# Patient Record
Sex: Male | Born: 1960 | Race: Black or African American | Hispanic: No | Marital: Married | Smoking: Current some day smoker
Health system: Southern US, Community
[De-identification: ages and names within clinical notes are randomized; demographics above are authoritative.]

## PROBLEM LIST (undated history)

## (undated) DIAGNOSIS — M545 Low back pain, unspecified: Secondary | ICD-10-CM

## (undated) HISTORY — PX: HEMORRHOID SURGERY: SHX153

## (undated) HISTORY — DX: Low back pain, unspecified: M54.50

## (undated) HISTORY — DX: Low back pain: M54.5

---

## 2010-01-21 ENCOUNTER — Encounter: Payer: Self-pay | Admitting: Internal Medicine

## 2010-01-21 ENCOUNTER — Ambulatory Visit: Payer: Self-pay | Admitting: Internal Medicine

## 2010-01-22 ENCOUNTER — Encounter (INDEPENDENT_AMBULATORY_CARE_PROVIDER_SITE_OTHER): Payer: Self-pay | Admitting: *Deleted

## 2010-01-24 LAB — CONVERTED CEMR LAB
ALT: 24 units/L (ref 0–53)
Albumin: 4 g/dL (ref 3.5–5.2)
BUN: 12 mg/dL (ref 6–23)
Basophils Relative: 0.8 % (ref 0.0–3.0)
Chloride: 106 meq/L (ref 96–112)
Cholesterol: 225 mg/dL — ABNORMAL HIGH (ref 0–200)
Eosinophils Relative: 2.4 % (ref 0.0–5.0)
HCT: 39.8 % (ref 39.0–52.0)
Lymphs Abs: 3.1 10*3/uL (ref 0.7–4.0)
MCV: 90.4 fL (ref 78.0–100.0)
Monocytes Absolute: 0.7 10*3/uL (ref 0.1–1.0)
Potassium: 4.3 meq/L (ref 3.5–5.1)
RBC: 4.4 M/uL (ref 4.22–5.81)
TSH: 1.33 microintl units/mL (ref 0.35–5.50)
Total Protein: 7 g/dL (ref 6.0–8.3)
Triglycerides: 122 mg/dL (ref 0.0–149.0)
WBC: 4.5 10*3/uL (ref 4.5–10.5)

## 2010-04-03 ENCOUNTER — Encounter (INDEPENDENT_AMBULATORY_CARE_PROVIDER_SITE_OTHER): Payer: Self-pay

## 2010-04-04 ENCOUNTER — Ambulatory Visit: Payer: Self-pay | Admitting: Gastroenterology

## 2010-04-21 ENCOUNTER — Ambulatory Visit: Admit: 2010-04-21 | Payer: Self-pay | Admitting: Gastroenterology

## 2010-05-06 NOTE — Letter (Signed)
Summary: Pre Visit Letter Revised  [facility]  [address] [place], [place] [address]   Phone: [phone]  Fax: [phone]        01/22/2010 MRN: [id number] Kyle Jensen [address] [place], [place]  [address]             Procedure Date:  03/05/2010   Welcome to the Gastroenterology Division at [facility].    You are scheduled to see a nurse for your pre-procedure visit on 02/18/2010 at 8:30AM on the 3rd floor at [facility], [address]  We ask that you try to arrive at our office 15 minutes prior to your appointment time to allow for check-in.  Please take a minute to review the attached form.  If you answer "Yes" to one or more of the questions on the first page, we ask that you call the person listed at your earliest opportunity.  If you answer "No" to all of the questions, please complete the rest of the form and bring it to your appointment.    Your nurse visit will consist of discussing your medical and surgical history, your immediate family medical history, and your medications.   If you are unable to list all of your medications on the form, please bring the medication bottles to your appointment and we will list them.  We will need to be aware of both prescribed and over the counter drugs.  We will need to know exact dosage information as well.    Please be prepared to read and sign documents such as consent forms, a financial agreement, and acknowledgement forms.  If necessary, and with your consent, a friend or relative is welcome to sit-in on the nurse visit with you.  Please bring your insurance card so that we may make a copy of it.  If your insurance requires a referral to see a specialist, please bring your referral form from your primary care physician.  No co-pay is required for this nurse visit.     If you cannot keep your appointment, please call [phone] to cancel or reschedule prior to your appointment date.  This  allows [place] the opportunity to schedule an appointment for another patient in need of care.    Thank you for choosing [facility] for your medical needs.  We appreciate the opportunity to care for you.  Please visit [place] at our website  to learn more about our practice.  Sincerely, The Gastroenterology Division

## 2010-05-06 NOTE — Assessment & Plan Note (Signed)
Summary: new to est//requesting cpx//will be fasting//lch   Vital Signs:  Patient profile:   50 year old male Height:      69 inches Weight:      183.50 pounds BMI:     27.20 Pulse rate:   75 / minute Pulse rhythm:   regular BP sitting:   130 / 82  (left arm) Cuff size:   regular  Vitals Entered By: Army Fossa CMA (January 21, 2010 2:12 PM) CC: New to establish, CPX, fasting Comments declines flu shot and Td due for colonoscopy [facility]   History of Present Illness: New patient CPX  feels well  no recent check ups  Preventive Screening-Counseling & Management  Alcohol-Tobacco     Smoking Status: current     Packs/Day: 4 a day  Caffeine-Diet-Exercise     Does Patient Exercise: yes      Drug Use:  no.    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   Risk Factors: Exercise: yes (01/21/2010)  Risk Factors: Smoking Status: current (01/21/2010) Packs/Day: 4 a day (01/21/2010)  Past Medical History: no major medical illness  Past Surgical History: hemorrhoidectomy (was in college)  Family History: Lung cancer --mother , smoker  colon ca--no prostate ca-- GF dx at age late 70s Diabetes -- GF MI--no  Social History: married 3 children Current Smoker-- 1/4 ppd  Alcohol use--yes, weekends , social Drug use-no Regular exercise--yes, plays football sundays  Occupation: Clinical biochemist, 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 CV:  Denies palpitations and swelling of feet; 2 months ago have a two-hour episode of chest pain that was steady and worse when he moved  his torso it was located at the left anterior chest without radiation, no associated diaphoresis or shortness of breath. Resp:  Denies cough and shortness of breath. GI:  3 weeks ago had a single episode   red blood per rectum,  he bled  enough that he saw blood in the water and  bowl. At the time had no anal  pain.. GU:  Denies dysuria, hematuria, and urinary frequency; occasionally hesitancy . Psych:  Denies anxiety and depression.  Physical Exam  General:  alert, well-developed, and well-nourished.   Neck:  no masses, no thyromegaly, and normal carotid upstroke.   Lungs:  normal respiratory effort, no intercostal retractions, no accessory muscle use, and normal breath sounds.   Heart:  normal rate, regular rhythm, no murmur, and no gallop.   Abdomen:  soft, non-tender, no distention, no masses, no guarding, and no rigidity.   Rectal:  small external hemorrhoid noted. Normal sphincter tone. No rectal masses or tenderness. Brown stools, Hemoccult negative Prostate:  Prostate gland firm and smooth, no nodularity, tenderness, mass, asymmetry or induration. prostate is slightly enlarged Extremities:  no lower extremity edema Neurologic:  alert & oriented X3, strength normal in all extremities, and gait normal.   Psych:  Cognition and judgment appear intact. Alert and cooperative with normal attention span and concentration, not anxious appearing and not depressed appearing.     Impression & Recommendations:  Problem # 1:  ROUTINE GENERAL MEDICAL EXAM@HEALTH  CARE FACL (ICD-V70.0) Td-- ? 2005 flu shot-- declined , explained benefits   he presents today with a single episode of rectal bleeding 3 weeks ago, digital rectal exam is  negative, Hemoccult negative today. He never had a colonoscopy consequently I will  recommend him to have one  since he is 50 y/o   tobacco-- counseled , information provided EKG, axis deviated, otherwise wnl   Diet exercise discussed  Orders: Venipuncture (54098) TLB-BMP (Basic Metabolic Panel-BMET) (80048-METABOL) TLB-CBC Platelet - w/Differential (85025-CBCD) TLB-Hepatic/Liver Function Pnl (80076-HEPATIC) TLB-Lipid Panel (80061-LIPID) TLB-TSH (Thyroid Stimulating Hormone) (84443-TSH) TLB-PSA (Prostate Specific Antigen) (84153-PSA) Gastroenterology Referral (GI) EKG w/ Interpretation (93000)  Problem # 2:  chest pain single episode of chest pain 2 months ago, somehow atypical. Patient recommended to call me immediately if  pain resurface  Other Orders: Specimen Handling (11914)   Patient Instructions: 1)  Please schedule a follow-up appointment in 1 year.    Orders Added: 1)  Venipuncture [36415] 2)  TLB-BMP (Basic Metabolic Panel-BMET) [80048-METABOL] 3)  TLB-CBC Platelet - w/Differential [85025-CBCD] 4)  TLB-Hepatic/Liver Function Pnl [80076-HEPATIC] 5)  TLB-Lipid Panel [80061-LIPID] 6)  TLB-TSH (Thyroid Stimulating Hormone) [84443-TSH] 7)  TLB-PSA (Prostate Specific Antigen) [84153-PSA] 8)  Specimen Handling [99000] 9)  Gastroenterology Referral [GI] 10)  EKG w/ Interpretation [93000] 11)  New Patient 40-64 years [99386]     Risk Factors:  Tobacco use:  current    Cigarettes:  Yes -- 4 a day pack(s) per day Drug use:  no Alcohol use:  yes Exercise:  yes

## 2010-05-08 NOTE — Letter (Signed)
Summary: Moviprep Instructions  [facility]  [address]   [place], [place] [address]   Phone: [phone]  Fax: [phone]       Kyle Jensen    05-Jan-1961    MRN: [id number]        Procedure Day Dorna Bloom: Monday, 04-21-10     Arrival Time: 2:00 p.m.     Procedure Time: 3:00 p.m.     Location of Procedure:                    x  [facility] (4th Floor)                        PREPARATION FOR COLONOSCOPY WITH MOVIPREP   Starting 5 days prior to your procedure 04-16-10 do not eat nuts, seeds, popcorn, corn, beans, peas,  salads, or any raw vegetables.  Do not take any fiber supplements (e.g. Metamucil, Citrucel, and Benefiber).  THE DAY BEFORE YOUR PROCEDURE         DATE: 04-20-10   DAY: Sunday  1.  Drink clear liquids the entire day-NO SOLID FOOD  2.  Do not drink anything colored red or purple.  Avoid juices with pulp.  No orange juice.  3.  Drink at least 64 oz. (8 glasses) of fluid/clear liquids during the day to prevent dehydration and help the prep work efficiently.  CLEAR LIQUIDS INCLUDE: Water Jello Ice Popsicles Tea (sugar ok, no milk/cream) Powdered fruit flavored drinks Coffee (sugar ok, no milk/cream) Gatorade Juice: apple, white grape, white cranberry  Lemonade Clear bullion, consomm, broth Carbonated beverages (any kind) Strained chicken noodle soup Hard Candy                             4.  In the morning, mix first dose of MoviPrep solution:    Empty 1 Pouch A and 1 Pouch B into the disposable container    Add lukewarm drinking water to the top line of the container. Mix to dissolve    Refrigerate (mixed solution should be used within 24 hrs)  5.  Begin drinking the prep at 5:00 p.m. The MoviPrep container is divided by 4 marks.   Every 15 minutes drink the solution down to the next mark (approximately 8 oz) until the full liter is complete.   6.  Follow completed prep with 16 oz of clear liquid of your choice (Nothing  red or purple).  Continue to drink clear liquids until bedtime.  7.  Before going to bed, mix second dose of MoviPrep solution:    Empty 1 Pouch A and 1 Pouch B into the disposable container    Add lukewarm drinking water to the top line of the container. Mix to dissolve    Refrigerate  THE DAY OF YOUR PROCEDURE      DATE: 04-21-10  DAY: Monday  Beginning at 10:00 a.m. (5 hours before procedure):         1. Every 15 minutes, drink the solution down to the next mark (approx 8 oz) until the full liter is complete.  2. Follow completed prep with 16 oz. of clear liquid of your choice.    3. You may drink clear liquids until 1:00 p.m. (2 HOURS BEFORE PROCEDURE).   MEDICATION INSTRUCTIONS  Unless otherwise instructed, you should take regular prescription medications with a small sip of water   as early as possible the morning  of your procedure.          OTHER INSTRUCTIONS  You will need a responsible adult at least 51 years of age to accompany you and drive you home.   This person must remain in the waiting room during your procedure.  Wear loose fitting clothing that is easily removed.  Leave jewelry and other valuables at home.  However, you may wish to bring a book to read or  an iPod/MP3 player to listen to music as you wait for your procedure to start.  Remove all body piercing jewelry and leave at home.  Total time from sign-in until discharge is approximately 2-3 hours.  You should go home directly after your procedure and rest.  You can resume normal activities the  day after your procedure.  The day of your procedure you should not:   Drive   Make legal decisions   Operate machinery   Drink alcohol   Return to work  You will receive specific instructions about eating, activities and medications before you leave.    The above instructions have been reviewed and explained to me by   Doristine Church RN II  April 15, 2010 3:09 PM     I fully  understand and can verbalize these instructions _____________________________ Date _________   Appended Document: Moviprep Instructions Moviprep given (sample)  [id number] REV 06/11 [id number]

## 2010-05-15 ENCOUNTER — Ambulatory Visit (INDEPENDENT_AMBULATORY_CARE_PROVIDER_SITE_OTHER): Payer: BC Managed Care – PPO | Admitting: Internal Medicine

## 2010-05-15 ENCOUNTER — Encounter: Payer: Self-pay | Admitting: Internal Medicine

## 2010-05-15 DIAGNOSIS — M549 Dorsalgia, unspecified: Secondary | ICD-10-CM | POA: Insufficient documentation

## 2010-05-15 LAB — CONVERTED CEMR LAB
Bilirubin Urine: NEGATIVE
Blood in Urine, dipstick: NEGATIVE
Glucose, Urine, Semiquant: NEGATIVE
Protein, U semiquant: NEGATIVE
pH: 7

## 2010-05-22 NOTE — Assessment & Plan Note (Signed)
Summary: back pain/cbs   Vital Signs:  Patient profile:   50 year old male Weight:      181 pounds Pulse rate:   76 / minute Pulse rhythm:   regular BP sitting:   126 / 80  (left arm) Cuff size:   regular  Vitals Entered By: Army Fossa CMA (May 15, 2010 10:56 AM) CC: Lower back pain  Comments x 2 weeks No problmes urinating  [facility] [address]    History of Present Illness: L flank pain for 2 weeks, pain is located at the left paraspinal muscles by T12 and L1 No radiation except for today when the pain radiated to the left buttock. The pain is not consistently increased by bending or twisting his torso had pain this morning, but he  is  now pain-free.  ROS No b/b incontinence no fever No rash in the back No recent injury no Abdominal pain   Current Medications (verified): 1)  None  Allergies (verified): No Known Drug Allergies  Past History:  Past Medical History: Reviewed history from 01/21/2010 and no changes required. no major medical illness  Past Surgical History: Reviewed history from 01/21/2010 and no changes required. hemorrhoidectomy (was in college)  Social History: Reviewed history from 01/21/2010 and no changes required. 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   Abdomen:  soft, non-tender, no distention, no masses, no guarding, and no rigidity.   Msk:  no tender to palpation in the back Extremities:  no lower extremity edema Neurologic:  alert & oriented X3, strength normal in all extremities, gait normal, and DTRs symmetrical and normal.  straight leg test negative   Impression & Recommendations:  Problem # 1:  BACK PAIN (ICD-724.5)  back pain with no evidence of radiculopathy He feels very well this  morning Conservative treatment, see instructions   Orders: UA Dipstick w/o Micro (automated)  (81003)  Patient Instructions: 1)  rest, no heavy lifting 2)  Take 200 mg of Ibuprofen (Advil, Motrin)--- 2 tablets  with food every 6 hours as needed  for relief of pain; watch for stomach irritation 3)  call if symptoms severe or no better in 3 weeks    Orders Added: 1)  UA Dipstick w/o Micro (automated)  [81003] 2)  Est. Patient Level III [19147]    Laboratory Results   Urine Tests    Routine Urinalysis   Color: yellow Appearance: Clear Glucose: negative   (Normal Range: Negative) Bilirubin: negative   (Normal Range: Negative) Ketone: negative   (Normal Range: Negative) Spec. Gravity: 1.015   (Normal Range: 1.003-1.035) Blood: negative   (Normal Range: Negative) pH: 7.0   (Normal Range: 5.0-8.0) Protein: negative   (Normal Range: Negative) Urobilinogen: 0.2   (Normal Range: 0-1) Nitrite: negative   (Normal Range: Negative) Leukocyte Esterace: negative   (Normal Range: Negative)    Comments: Army Fossa CMA  May 15, 2010 11:00 AM

## 2010-06-04 ENCOUNTER — Encounter: Payer: Self-pay | Admitting: Gastroenterology

## 2010-06-04 ENCOUNTER — Other Ambulatory Visit: Payer: Self-pay | Admitting: Gastroenterology

## 2010-06-12 NOTE — Miscellaneous (Addendum)
Summary: No Show for colonoscopy  Clinical Lists Changes   he did not show for colonoscopy today. he has cancelled 2 previous times.  He should be billed the no show fee he agreed to.  He should not be rescheduled for colonoscopy unless he has a new pre-visit appt so we can make sure he is well aware of times.

## 2011-12-14 ENCOUNTER — Encounter: Payer: Self-pay | Admitting: Internal Medicine

## 2011-12-14 ENCOUNTER — Ambulatory Visit (INDEPENDENT_AMBULATORY_CARE_PROVIDER_SITE_OTHER): Payer: BC Managed Care – PPO | Admitting: Internal Medicine

## 2011-12-14 ENCOUNTER — Ambulatory Visit (INDEPENDENT_AMBULATORY_CARE_PROVIDER_SITE_OTHER)
Admission: RE | Admit: 2011-12-14 | Discharge: 2011-12-14 | Disposition: A | Discharge status: Home / Self Care | Payer: BC Managed Care – PPO | Source: Ambulatory Visit | Attending: Internal Medicine | Admitting: Internal Medicine

## 2011-12-14 ENCOUNTER — Telehealth: Payer: Self-pay | Admitting: Internal Medicine

## 2011-12-14 VITALS — BP 134/72 | HR 77 | Temp 98.3°F | Resp 14 | Wt 183.0 lb

## 2011-12-14 DIAGNOSIS — R52 Pain, unspecified: Secondary | ICD-10-CM

## 2011-12-14 DIAGNOSIS — R109 Unspecified abdominal pain: Secondary | ICD-10-CM

## 2011-12-14 DIAGNOSIS — M545 Low back pain: Secondary | ICD-10-CM

## 2011-12-14 LAB — POCT URINALYSIS DIPSTICK
Bilirubin, UA: NEGATIVE
Leukocytes, UA: NEGATIVE
Nitrite, UA: NEGATIVE
pH, UA: 7

## 2011-12-14 MED ORDER — CYCLOBENZAPRINE HCL 5 MG PO TABS: ORAL_TABLET | ORAL | Status: AC

## 2011-12-14 MED ORDER — TRAMADOL HCL 50 MG PO TABS: 50.0000 mg | ORAL_TABLET | Freq: Four times a day (QID) | ORAL | Status: AC | PRN

## 2011-12-14 NOTE — Telephone Encounter (Signed)
Caller: Kyle Jensen/Patient; PCP: Marga Melnick; CB#: [phone]; Call regarding Back Pain Pt is calling with back pain. Pt has been having low back pain x 1 month. Pt started working out again at that time. Rn triaged and advised an appointment. Pt scheduled at 11:30 with Dr. Alwyn Ren.

## 2011-12-14 NOTE — Telephone Encounter (Signed)
Noted, as per Dr.Hopper's protocol if patient is scheduled for an appointment or sent to ER ok to sign off on Encounter

## 2011-12-14 NOTE — Patient Instructions (Addendum)
The best exercises for the low back include freestyle swimming, stretch aerobics, and yoga.  If you activate My Chart; the results can be released to you as soon as they populate from the lab. If you choose not to use this program; the labs have to be reviewed, copied & mailed   causing a delay in getting the results to you.  

## 2011-12-14 NOTE — Progress Notes (Signed)
  Subjective:    Patient ID: Kyle Jensen, male    DOB: [birth year], 51 y.o.   MRN: [id number]  HPI He describes bilateral lumbosacral level back pain  for 4-6 weeks; this has been described as dull and retracted out of bed today. Today it was more severe & sharp , up to 9, causing him to be bent forward. He has been taking Advil over-the-counter 2 pills twice a day and using heat with only temporary relief. There was no history of any injury or repetitive motion as a factor in his back pain. He has been working out for several months the gym lifting weights. He is not believe that this is aggravated his symptoms He had been treated for back pain remotely in February 2012.  The abdominal pain began 9/8 as sharp pain in both lower quadrants, right greater than left. This was described as up to a level 8. He questions if this is temporally related to the back pain     Review of Systems  Back pain ROS: Fecal/urinary incontinence:no Numbness/Weakness:no Fever/chills/sweats: sweating this am Unexplained weight loss: no No relief with bedrest: yes even turning mattress  h/o cancer/immunosuppression: no PMH of osteoporosis or chronic steroid use:no  Abd pain ROS: Nausea/Vomiting: no Diarrhea: no  Constipation: no Melena/BRBPR: no Hematemesis: no  Anorexia: yes Dysuria/hematuria/pyuria: no Rash:no          Objective:   Physical Exam Gen.: Healthy and well-nourished in appearance. Alert, appropriate and cooperative throughout exam. Uncomfortable Head: Normocephalic without obvious abnormalities; head shaven Eyes: No corneal or conjunctival inflammation noted.Arcus senilis. Sclerae slightly injected Neck: No deformities, masses, or tenderness noted. Range of motion normal Lungs: Normal respiratory effort; chest expands symmetrically. Lungs are clear to auscultation without rales, wheezes, or increased work of breathing. Heart: Normal rate and rhythm. Normal S1 and S2. No gallop, click,  or rub. Grade 1/2 over 6 systolic murmur   Abdomen: Bowel sounds normal; abdomen soft and nontender. No masses, organomegaly or hernias noted.  Musculoskeletal/extremities: No deformity or scoliosis noted of  the thoracic or lumbar spine but spine straight with loss of subtle curvature. No clubbing, cyanosis, edema, or deformity noted. Range of motion  normal .Tone & strength  normal.Joints normal. Nail health  Good. Slight crepitus R knee. He is able to lie flat and sit up without help but moves slowly  Vascular: Carotid, radial artery, dorsalis pedis and  posterior tibial pulses are full and equal. No bruits present. Neurologic: Alert and oriented x3. Deep tendon reflexes symmetrical and normal.  Gait including tiptoe and heel walking is normal.        Skin: Intact without suspicious lesions or rashes . Tatooes diffusely Lymph: No cervical, axillary lymphadenopathy present. Psych: Mood and affect are normal. Normally interactive                                                                                         Assessment & Plan:  #1 bilateral lower back pain; T11-L1 level suggested  #2 abdominal pain probably due to radicular radiation of #1  Plan: See orders and recommendations

## 2011-12-15 ENCOUNTER — Encounter: Payer: Self-pay | Admitting: Internal Medicine

## 2012-03-25 ENCOUNTER — Ambulatory Visit (INDEPENDENT_AMBULATORY_CARE_PROVIDER_SITE_OTHER): Payer: BC Managed Care – PPO | Admitting: Emergency Medicine

## 2012-03-25 VITALS — BP 111/76 | HR 78 | Temp 98.0°F | Resp 18 | Ht 67.5 in | Wt 181.0 lb

## 2012-03-25 DIAGNOSIS — Z0000 Encounter for general adult medical examination without abnormal findings: Secondary | ICD-10-CM

## 2012-03-25 LAB — COMPREHENSIVE METABOLIC PANEL
BUN: 18 mg/dL (ref 6–23)
CO2: 25 mEq/L (ref 19–32)
Calcium: 9.8 mg/dL (ref 8.4–10.5)
Chloride: 102 mEq/L (ref 96–112)
Creat: 1.12 mg/dL (ref 0.50–1.35)
Total Bilirubin: 0.4 mg/dL (ref 0.3–1.2)

## 2012-03-25 LAB — LIPID PANEL
Cholesterol: 246 mg/dL — ABNORMAL HIGH (ref 0–200)
HDL: 68 mg/dL (ref >39)
Total CHOL/HDL Ratio: 3.6 Ratio
Triglycerides: 149 mg/dL (ref <150)
VLDL: 30 mg/dL (ref 0–40)

## 2012-03-25 LAB — POCT CBC
Granulocyte percent: 65.9 %G (ref 37–80)
HCT, POC: 47.7 % (ref 43.5–53.7)
Hemoglobin: 14.8 g/dL (ref 14.1–18.1)
MCV: 90.7 fL (ref 80–97)
POC LYMPH PERCENT: 28.5 %L (ref 10–50)
RBC: 5.26 M/uL (ref 4.69–6.13)
RDW, POC: 14.5 %

## 2012-03-25 LAB — POCT URINALYSIS DIPSTICK
Bilirubin, UA: NEGATIVE
Leukocytes, UA: NEGATIVE
Protein, UA: NEGATIVE
Spec Grav, UA: 1.025
pH, UA: 5

## 2012-03-25 LAB — POCT UA - MICROSCOPIC ONLY
Bacteria, U Microscopic: NEGATIVE
Epithelial cells, urine per micros: NEGATIVE
Mucus, UA: NEGATIVE

## 2012-03-25 NOTE — Progress Notes (Signed)
[facility] [address], [place] [address] [phone]  Date:  03/25/2012   Name:  Kyle Jensen   DOB:  03-05-1961   MRN:  [id number]  PCP:  Willow Ora, MD    Chief Complaint: Annual Exam   History of Present Illness:  Kyle Jensen is a 51 y.o. very pleasant male patient who presents with the following:  For insurance required wellness exam.  Nonsmoker.  Refuses flu shot.  No meds.  No current complaints.  Patient Active Problem List  Diagnosis  . BACK PAIN    No past medical history on file.  Past Surgical History  Procedure Date  . Hemorrhoid surgery     History  Substance Use Topics  . Smoking status: Current Some Day Smoker  . Smokeless tobacco: Not on file     Comment: on weekends  . Alcohol Use: Yes     Comment:  occasionally    Family History  Problem Relation Age of Onset  . Lung cancer Mother   . Prostate cancer Maternal Grandfather   . Diabetes Maternal Grandfather     No Known Allergies  Medication list has been reviewed and updated.  No current outpatient prescriptions on file prior to visit.    Review of Systems:  As per HPI, otherwise negative.    Physical Examination: Filed Vitals:   03/25/12 1049  BP: 111/76  Pulse: 78  Temp: 98 F (36.7 C)  Resp: 18   Filed Vitals:   03/25/12 1049  Height: 5' 7.5" (1.715 m)  Weight: 181 lb (82.101 kg)   Body mass index is 27.93 kg/(m^2). Ideal Body Weight: Weight in (lb) to have BMI = 25: 161.7   GEN: WDWN, NAD, Non-toxic, A & O x 3 HEENT: Atraumatic, Normocephalic. Neck supple. No masses, No LAD. Ears and Nose: No external deformity. CV: RRR, No M/G/R. No JVD. No thrill. No extra heart sounds. PULM: CTA B, no wheezes, crackles, rhonchi. No retractions. No resp. distress. No accessory muscle use. ABD: S, NT, ND, +BS. No rebound. No HSM. EXTR: No c/c/e NEURO Normal gait.  PSYCH: Normally interactive. Conversant. Not depressed or anxious appearing.  Calm  demeanor.  RECTAL:  Rectal negative  Assessment and Plan: Wellness  Exam Labs Colonoscopy Follow up based on labs  Carmelina Dane, MD

## 2012-03-26 LAB — VITAMIN D 25 HYDROXY (VIT D DEFICIENCY, FRACTURES): Vit D, 25-Hydroxy: 30 ng/mL (ref 30–89)

## 2012-03-26 LAB — TESTOSTERONE: Testosterone: 221.19 ng/dL — ABNORMAL LOW (ref 300–890)

## 2012-05-21 ENCOUNTER — Other Ambulatory Visit: Payer: Self-pay

## 2013-02-09 ENCOUNTER — Other Ambulatory Visit: Payer: Self-pay

## 2015-10-29 ENCOUNTER — Ambulatory Visit: Payer: Self-pay

## 2015-10-30 ENCOUNTER — Telehealth: Payer: Self-pay | Admitting: Internal Medicine

## 2015-10-30 ENCOUNTER — Emergency Department (HOSPITAL_COMMUNITY): Payer: 59

## 2015-10-30 ENCOUNTER — Ambulatory Visit (INDEPENDENT_AMBULATORY_CARE_PROVIDER_SITE_OTHER): Payer: 59 | Admitting: Physician Assistant

## 2015-10-30 ENCOUNTER — Emergency Department (HOSPITAL_COMMUNITY)
Admission: EM | Admit: 2015-10-30 | Discharge: 2015-10-30 | Disposition: A | Discharge status: Home / Self Care | Payer: 59 | Attending: Emergency Medicine | Admitting: Emergency Medicine

## 2015-10-30 ENCOUNTER — Encounter (HOSPITAL_COMMUNITY): Payer: Self-pay

## 2015-10-30 ENCOUNTER — Ambulatory Visit: Payer: Self-pay

## 2015-10-30 VITALS — BP 132/80 | HR 80 | Temp 98.0°F | Resp 18 | Ht 67.5 in | Wt 189.6 lb

## 2015-10-30 DIAGNOSIS — R0602 Shortness of breath: Secondary | ICD-10-CM | POA: Diagnosis not present

## 2015-10-30 DIAGNOSIS — Z1329 Encounter for screening for other suspected endocrine disorder: Secondary | ICD-10-CM

## 2015-10-30 DIAGNOSIS — Z131 Encounter for screening for diabetes mellitus: Secondary | ICD-10-CM | POA: Diagnosis not present

## 2015-10-30 DIAGNOSIS — R079 Chest pain, unspecified: Secondary | ICD-10-CM

## 2015-10-30 DIAGNOSIS — F17200 Nicotine dependence, unspecified, uncomplicated: Secondary | ICD-10-CM | POA: Diagnosis not present

## 2015-10-30 DIAGNOSIS — Z13220 Encounter for screening for lipoid disorders: Secondary | ICD-10-CM | POA: Diagnosis not present

## 2015-10-30 DIAGNOSIS — R9431 Abnormal electrocardiogram [ECG] [EKG]: Secondary | ICD-10-CM | POA: Diagnosis not present

## 2015-10-30 LAB — I-STAT TROPONIN, ED
Troponin i, poc: 0 ng/mL (ref 0.00–0.08)
Troponin i, poc: 0 ng/mL (ref 0.00–0.08)

## 2015-10-30 LAB — CBC WITH DIFFERENTIAL/PLATELET
BASOS PCT: 0 %
Basophils Absolute: 0 10*3/uL (ref 0.0–0.1)
Eosinophils Absolute: 0.1 10*3/uL (ref 0.0–0.7)
Eosinophils Relative: 1 %
HEMATOCRIT: 42.3 % (ref 39.0–52.0)
Hemoglobin: 14.4 g/dL (ref 13.0–17.0)
Lymphocytes Relative: 30 %
Lymphs Abs: 2.2 10*3/uL (ref 0.7–4.0)
MCH: 28.9 pg (ref 26.0–34.0)
MCHC: 34 g/dL (ref 30.0–36.0)
MCV: 84.9 fL (ref 78.0–100.0)
MONO ABS: 0.6 10*3/uL (ref 0.1–1.0)
MONOS PCT: 8 %
NEUTROS ABS: 4.5 10*3/uL (ref 1.7–7.7)
Neutrophils Relative %: 61 %
Platelets: 176 10*3/uL (ref 150–400)
RBC: 4.98 MIL/uL (ref 4.22–5.81)
RDW: 13.5 % (ref 11.5–15.5)
WBC: 7.4 10*3/uL (ref 4.0–10.5)

## 2015-10-30 LAB — BASIC METABOLIC PANEL
Anion gap: 9 (ref 5–15)
BUN: 12 mg/dL (ref 6–20)
CHLORIDE: 104 mmol/L (ref 101–111)
CO2: 25 mmol/L (ref 22–32)
Calcium: 9.7 mg/dL (ref 8.9–10.3)
Creatinine, Ser: 1.08 mg/dL (ref 0.61–1.24)
GFR calc Af Amer: >60 mL/min (ref >60)
GLUCOSE: 92 mg/dL (ref 65–99)
POTASSIUM: 4 mmol/L (ref 3.5–5.1)
Sodium: 138 mmol/L (ref 135–145)

## 2015-10-30 MED ORDER — ASPIRIN 81 MG PO CHEW
324.0000 mg | CHEWABLE_TABLET | Freq: Once | ORAL | Status: AC
  Administered 2015-10-30: 324 mg via ORAL

## 2015-10-30 NOTE — ED Notes (Signed)
Dc instructions discussed and scripts reviewed with pt and pt's wife. Pt d/c home.

## 2015-10-30 NOTE — ED Notes (Signed)
Patient undressed, in gown, on monitor, continuous pulse oximetry and blood pressure cuff 

## 2015-10-30 NOTE — ED Provider Notes (Signed)
[facility]-EMERGENCY DEPT Provider Note   CSN: [id number] Arrival date & time: 10/30/15  1516  First Provider Contact:  First MD Initiated Contact with Patient 10/30/15 1530        History   Chief Complaint Chief Complaint  Patient presents with  . Chest Pain    HPI Kyle Jensen is a 55 y.o. male.  The history is provided by the patient and a significant other.  Chest Pain    55 year old male with no significant past medical history presenting to the ED for intermittent episodes of chest pain over the past month. He states he has had approx 4 episodes of chest pain over the past month. He states generally these occur with exertion but can occur at rest as well.  States when it occurs it feels like a dull ache and last for several minutes before resolving spontaneously. States he occasionally will have some lightheadedness and shortness of breath with this. He does note some palpitations when this occurs as well. States his last episode was on Monday afternoon while he was at the gym.   States at this time his friend felt his heart while at the gym and it was noted to be "racing".  He was running beforehand.  He has no known cardiac history. No family cardiac history. Patient is a smoker.  He does report increased stress recently. He works 2 jobs, both 8 hours a day equating to a total of 16 hours of work per day. He states his first job has become recently more stressful for the past several years. He states driving to work he feels calm, but once he arrives he begins to feel "on edge".  Patient states he feels okay as of now. He denies any active chest pain or shortness of breath.  History reviewed. No pertinent past medical history.  Patient Active Problem List   Diagnosis Date Noted  . BACK PAIN 05/15/2010    Past Surgical History:  Procedure Laterality Date  . HEMORRHOID SURGERY         Home Medications    Prior to Admission medications   Medication Sig Start Date End Date  Taking? Authorizing Provider  Multiple Vitamins-Minerals (ONE-A-DAY MENS 50+ ADVANTAGE) TABS Take 1 tablet by mouth daily after breakfast.   Yes Historical Provider, MD    Family History Family History  Problem Relation Age of Onset  . Lung cancer Mother   . Cancer Mother   . Prostate cancer Maternal Grandfather   . Diabetes Maternal Grandfather     Social History Social History  Substance Use Topics  . Smoking status: Current Some Day Smoker  . Smokeless tobacco: Not on file     Comment: on weekends  . Alcohol use Yes     Comment:  occasionally     Allergies   Review of patient's allergies indicates no known allergies.   Review of Systems Review of Systems  Cardiovascular: Positive for chest pain.  All other systems reviewed and are negative.    Physical Exam Updated Vital Signs BP 149/92   Pulse 76   Temp 98.1 F (36.7 C) (Oral)   Resp 16   Ht  (1.727 m)   Wt 86.2 kg   SpO2 100%   BMI 28.89 kg/m   Physical Exam  Constitutional: He is oriented to person, place, and time. He appears well-developed and well-nourished.  Overall appears well  HENT:  Head: Normocephalic and atraumatic.  Mouth/Throat: Oropharynx is clear and moist.  Eyes: Conjunctivae and EOM are normal. Pupils are equal, round, and reactive to light.  Neck: Normal range of motion.  Cardiovascular: Normal rate, regular rhythm and normal heart sounds.   Pulmonary/Chest: Effort normal and breath sounds normal.  Abdominal: Soft. Bowel sounds are normal.  Musculoskeletal: Normal range of motion.  No edema No calf pain or tenderness, no calf swelling  Neurological: He is alert and oriented to person, place, and time.  Skin: Skin is warm and dry.  Psychiatric: He has a normal mood and affect.  Nursing note and vitals reviewed.    ED Treatments / Results  Labs (all labs ordered are listed, but only abnormal results are displayed) Labs Reviewed  CBC WITH DIFFERENTIAL/PLATELET  BASIC  METABOLIC PANEL  I-STAT TROPOININ, ED  Rosezena Sensor, ED    EKG  EKG Interpretation  Date/Time:  Wednesday October 30 2015 15:21:51 EDT Ventricular Rate:  71 PR Interval:    QRS Duration: 107 QT Interval:  396 QTC Calculation: 431 R Axis:   -51 Text Interpretation:  Sinus rhythm Probable left atrial enlargement Left anterior fascicular block Low voltage, precordial leads Abnormal R-wave progression, late transition Left ventricular hypertrophy Borderline T abnormalities, inferior leads No old tracing to compare Confirmed by CAMPOS  MD, Caryn Bee ([id number]) on 10/30/2015 4:05:20 PM       Radiology Dg Chest 2 View  Result Date: 10/30/2015 CLINICAL DATA:  Shortness of breath and chest pain for 2 days EXAM: CHEST  2 VIEW COMPARISON:  None. FINDINGS: There is a calcified granuloma in the lateral left base. Lungs elsewhere clear. Heart size and pulmonary vascularity are normal. No adenopathy. No bone lesions. No pneumothorax. IMPRESSION: Calcified granuloma left base.  No edema or consolidation. Electronically Signed   By: Bretta Bang III M.D.   On: 10/30/2015 16:00   Procedures Procedures (including critical care time)  Medications Ordered in ED Medications - No data to display   Initial Impression / Assessment and Plan / ED Course  I have reviewed the triage vital signs and the nursing notes.  Pertinent labs & imaging results that were available during my care of the patient were reviewed by me and considered in my medical decision making (see chart for details).  Clinical Course   55 year old male here with intermittent chest pain over the past year. He reports this does happen with exertion, but sometimes will happen at rest as well. He does report some palpitations and shortness of breath when this occurs. No cardiac history. Does report smoking history and increased stress recently. His EKG does have some T-wave changes inferiorly, unknown age. No old EKG available for  comparison. His labs are reassuring. Chest x-ray with calcified granuloma at lung base, otherwise clear. Patient was observed in the ED, he has remained asymptomatic here. His delta troponin is also negative. Patient's symptoms are somewhat atypical and seems to be variable in character and symptomology.  He does report stress at his job related which is likely playing a role in his symptoms as well.  Given that patient has remained asymptomatic for greater than 48 hours and has a negative workup., Feel he is stable for discharge. He will be given outpatient cardiology follow-up.  Discussed plan with patient, he/she acknowledged understanding and agreed with plan of care.  Return precautions given for new or worsening symptoms.  Case discussed with attending physician, Dr. Patria Mane, who evaluated patient and agrees with assessment and plan of care.  Final Clinical Impressions(s) / ED Diagnoses   Final  diagnoses:  Chest pain, unspecified chest pain type    New Prescriptions New Prescriptions   No medications on file     Oletha Blend 10/30/15 1956    Azalia Bilis, MD 10/30/15 2300

## 2015-10-30 NOTE — Telephone Encounter (Signed)
Pt of Dr Alwyn Ren now assigned to me, went to the ER w/ CP. Please call pt, be sure he has a f/u w/ cards as recommended by ED MD. Place a referral if needed. Also rec to schedule a visit here to get established

## 2015-10-30 NOTE — ED Triage Notes (Signed)
Pt brought in via EMS with chest pain intermittently for the past four days. Pt on RA and is ambulatory. Pt denies CP or SOB at this time. Pt received  324 mg of ASA from EMS.

## 2015-10-30 NOTE — Patient Instructions (Signed)
     IF you received an x-ray today, you will receive an invoice from [facility]. Please contact [facility] at [phone] with questions or concerns regarding your invoice.   IF you received labwork today, you will receive an invoice from [facility]. Please contact [facility] at [phone] with questions or concerns regarding your invoice.   Our billing staff will not be able to assist you with questions regarding bills from these companies.  You will be contacted with the lab results as soon as they are available. The fastest way to get your results is to activate your My Chart account. Instructions are located on the last page of this paperwork. If you have not heard from us regarding the results in 2 weeks, please contact this office.      

## 2015-10-30 NOTE — Progress Notes (Signed)
10/30/2015 2:20 PM   DOB: January 12, 1961 / MRN: [id number]  SUBJECTIVE:  Kyle Jensen is a 55 y.o. male presenting for chest pian that started 4 days ago.  Reports he was lying down and was awoken from sleep because he could not breath and was having some "mild" mid substernal chest pain that he describes as a tightness.  He was a little dizzy at that time.  He is an avid exerciser and denies any chest pain and SOB with his most recent work outs. He reports he could not catch his breath after orgasm 3 days ago. He has not tried anything for the pain.   He smokes roughly 3 to 4 cigarettes a day.  He has done this for 15 years or so. He has a history of elevated LDL and cholesterol.   Depression screen PHQ 2/9 10/30/2015  Decreased Interest 0  Down, Depressed, Hopeless 0  PHQ - 2 Score 0     He has No Known Allergies.   He  has no past medical history on file.    He  reports that he has been smoking.  He does not have any smokeless tobacco history on file. He reports that he drinks alcohol. He reports that he does not use drugs. He  has no sexual activity history on file. The patient  has a past surgical history that includes Hemorrhoid surgery.  His family history includes Cancer in his mother; Diabetes in his maternal grandfather; Lung cancer in his mother; Prostate cancer in his maternal grandfather.  Review of Systems  Constitutional: Negative for fever.  Respiratory: Positive for cough and hemoptysis. Negative for wheezing.   Cardiovascular: Positive for chest pain, palpitations and PND. Negative for orthopnea and leg swelling.  Skin: Negative for itching and rash.  Neurological: Positive for dizziness.    Problem list and medications reviewed and updated by myself where necessary, and exist elsewhere in the encounter.   OBJECTIVE:  BP 132/80 (BP Location: Right Arm, Patient Position: Sitting, Cuff Size: Small)   Pulse 80   Temp 98 F (36.7 C) (Oral)   Resp 18   Ht 5' 7.5"  (1.715 m)   Wt 189 lb 9.6 oz (86 kg)   SpO2 100%   BMI 29.26 kg/m   Physical Exam  Constitutional: He is oriented to person, place, and time. He appears well-developed and well-nourished. No distress.  Cardiovascular: Normal rate, regular rhythm and normal heart sounds.   Pulmonary/Chest: Effort normal and breath sounds normal. No respiratory distress. He has no wheezes. He has no rales. He exhibits no tenderness.  Abdominal: Soft. Bowel sounds are normal. He exhibits no distension and no mass. There is no tenderness. There is no rebound and no guarding.  Musculoskeletal: Normal range of motion.  Neurological: He is alert and oriented to person, place, and time. He has normal reflexes. No cranial nerve deficit. He exhibits normal muscle tone. Coordination normal.  Skin: Skin is warm and dry. No rash noted. He is not diaphoretic. No erythema. No pallor.  Psychiatric: He has a normal mood and affect.    Lipid Panel     Component Value Date/Time   CHOL 246 (H) 03/25/2012 1148   TRIG 149 03/25/2012 1148   HDL 68 03/25/2012 1148   CHOLHDL 3.6 03/25/2012 1148   VLDL 30 03/25/2012 1148   LDLCALC 148 (H) 03/25/2012 1148   LDLDIRECT 149.5 01/21/2010 1429     No results found for this or any previous visit (from  the past 72 hour(s)).  No results found.  ASSESSMENT AND PLAN  Kyle Jensen was seen today for annual exam.  Diagnoses and all orders for this visit:  Exertional chest pain: I do not have access to an old EKG.  His HPI is very concerning.  I will refer him to cardiology however given his T-wave inversion in lead three and possible in AVF he needs a cardiac rule out today.  He will go directly to the ED under medical supervision. We have administered 324 of ASA here.  He is asymptomatic at this time thus will hold off nitro.  -     EKG 12-Lead -     Cancel: DG Chest 2 View; Future  SOB (shortness of breath) -     CBC  Screening for diabetes mellitus -     POCT glycosylated  hemoglobin (Hb A1C)  Screening for lipid disorders -     Lipid panel  T wave inversion in EKG: See problem 1.  -     aspirin chewable tablet 324 mg; Chew 4 tablets (324 mg total) by mouth once.    The patient was advised to call or return to clinic if he does not see an improvement in symptoms, or to seek the care of the closest emergency department if he worsens with the above plan.   Deliah Boston, MHS, PA-C [facility] 10/30/2015 2:20 PM

## 2015-10-30 NOTE — Discharge Instructions (Signed)
Your workup today including labs and chest x-ray were reassuring. We recommend that you follow-up with local cardiology group for recheck-- may call their office to schedule an appointment. You may also wish to follow-up with your primary care doctor and discussed the ED visit. Return here for any new or worsening symptoms.

## 2015-10-31 NOTE — Telephone Encounter (Signed)
Contacted the pt, appt set for 11/12/15 at 2:30

## 2015-10-31 NOTE — Telephone Encounter (Signed)
Cardiology Referral placed. Per Dr. Drue Novel, Pt needs a 30 minute re-establish appt (last seen in 2011) w/ him in the next 1-2 weeks (okay to put 2- 15 minute appts together). Thank you.

## 2015-11-11 ENCOUNTER — Telehealth: Payer: Self-pay | Admitting: Behavioral Health

## 2015-11-11 NOTE — Telephone Encounter (Signed)
Unable to reach patient at time of Pre-Visit Call.  Left message for patient to return call when available.    

## 2015-11-12 ENCOUNTER — Ambulatory Visit: Payer: 59 | Admitting: Internal Medicine

## 2015-11-28 ENCOUNTER — Ambulatory Visit: Payer: 59 | Admitting: Internal Medicine

## 2015-11-28 ENCOUNTER — Encounter: Payer: Self-pay | Admitting: *Deleted

## 2015-12-04 ENCOUNTER — Ambulatory Visit (INDEPENDENT_AMBULATORY_CARE_PROVIDER_SITE_OTHER): Payer: 59 | Admitting: Family Medicine

## 2015-12-04 ENCOUNTER — Ambulatory Visit (INDEPENDENT_AMBULATORY_CARE_PROVIDER_SITE_OTHER): Payer: 59

## 2015-12-04 VITALS — BP 140/82 | HR 78 | Temp 97.6°F | Resp 17 | Ht 67.5 in | Wt 189.0 lb

## 2015-12-04 DIAGNOSIS — M5442 Lumbago with sciatica, left side: Secondary | ICD-10-CM

## 2015-12-04 DIAGNOSIS — R292 Abnormal reflex: Secondary | ICD-10-CM

## 2015-12-04 DIAGNOSIS — M5136 Other intervertebral disc degeneration, lumbar region: Secondary | ICD-10-CM

## 2015-12-04 MED ORDER — PREDNISONE 20 MG PO TABS: ORAL_TABLET | ORAL | 0 refills | Status: AC

## 2015-12-04 MED ORDER — CYCLOBENZAPRINE HCL 5 MG PO TABS: ORAL_TABLET | ORAL | 0 refills | Status: DC

## 2015-12-04 NOTE — Progress Notes (Signed)
By signing my name below, I, [name], attest that this documentation has been prepared under the direction and in the presence of Meredith StaggersJeffrey Kenston Longton.  Electronically Signed: Arvilla MarketMesha Guinyard, Medical Scribe. 12/04/15. 9:22 AM.  Subjective:    Patient ID: Kyle Jensen, male    DOB: 03-Aug-1960, 55 y.o.   MRN: [id number]  HPI Chief Complaint  Patient presents with  . Hip Pain    Left. NKI   HPI Comments: Kyle Jensen is a 55 y.o. male who presents to the [facility] complaining of left hip pain onset 5 days ago. Pt mentions the pain is in his left lower back pain that radiates to his hip, and he reports numbness in his left leg.. Pt took 2-3 OTC ibuprofen BID, ice pack, heat pack with some relief to his symptoms. 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 Pt denies injury to his hip or participating in any strenuous activities that could cause his pain. Pt denies bowel incontinents, lower extremity weakness, and saddle anesthesia. Pt denies PMHx of DM, and had a nl glucose of 92 a month ago.  Patient Active Problem List   Diagnosis Date Noted  . BACK PAIN 05/15/2010   Past Medical History:  Diagnosis Date  . Lower back pain    Past Surgical History:  Procedure Laterality Date  . HEMORRHOID SURGERY     No Known Allergies Prior to Admission medications   Medication Sig Start Date End Date Taking? Authorizing Provider  Multiple Vitamins-Minerals (ONE-A-DAY MENS 50+ ADVANTAGE) TABS Take 1 tablet by mouth daily after breakfast.   Yes Historical Provider, MD   Social History   Social History  . Marital status: Married    Spouse name: N/A  . Number of children: N/A  . Years of education: N/A   Occupational History  . Not on file.   Social History Main Topics  . Smoking status: Current Some Day  Smoker  . Smokeless tobacco: Not on file     Comment: on weekends  . Alcohol use Yes     Comment:  occasionally  . Drug use: No  . Sexual activity: Not on file   Other Topics Concern  . Not on file   Social History Narrative  . No narrative on file   [facility] 12/04/2015 10/30/2015  Decreased Interest 0 0  Down, Depressed, Hopeless 0 0  PHQ - 2 Score 0 0   Review of Systems  Musculoskeletal: Positive for back pain.  Neurological: Positive for numbness. Negative for weakness.   Objective:  Physical Exam  Constitutional: He appears well-developed and well-nourished. No distress.  HENT:  Head: Normocephalic and atraumatic.  Eyes: Conjunctivae are normal.  Neck: Neck supple.  Cardiovascular: Normal rate.   Pulmonary/Chest: Effort normal.  Musculoskeletal:  30-40 degrees ROM lumbar spine with flexion Skin intact No rash No midline bony tenderness Tender along left SI down to the left sciatic notch  Decrease left lateral flexion due to pain Equal rotation Able to heel and toe walk with out difficulty Pain with seated straight leg raise, back and buttock only  Left hip: pain free ROM at the hip  Neurological: He is alert. He displays no Babinski's sign on the right side. He displays  no Babinski's sign on the left side.  Reflex Scores:      Patellar reflexes are 2+ on the right side and 0 on the left side.      Achilles reflexes are 2+ on the right side and 2+ on the left side. Skin: Skin is warm and dry.  Psychiatric: He has a normal mood and affect. His behavior is normal.  Nursing note and vitals reviewed.  BP 140/82 (BP Location: Right Arm, Patient Position: Sitting, Cuff Size: Normal)   Pulse 78   Temp 97.6 F (36.4 C) (Oral)   Resp 17   Ht 5' 7.5" (1.715 m)   Wt 189 lb (85.7 kg)   SpO2 99%   BMI 29.16 kg/m    Dg Lumbar Spine Complete  Result Date: 12/04/2015 CLINICAL DATA:  Low back pain, sciatic symptoms, decreased left-sided patellar  reflex. EXAM: LUMBAR SPINE - COMPLETE 4+ VIEW COMPARISON:  Lumbar spine series of December 14, 2011 FINDINGS: The lumbar vertebral bodies are preserved in height. There is minimal disc space narrowing at L4-5 and at L1-2. This is stable. There is no spondylolisthesis. There is mild facet joint hypertrophy at L4-5 and L5-S1. The pedicles and transverse processes are intact. The observed portions of the sacrum are normal. There is calcification in the wall of the abdominal aorta and common iliac vessels. 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 Mild facet joint hypertrophy at L4-5 and L5-S1. No compression fracture. Aortic atherosclerosis. Electronically Signed   By: David  [place] M.D.   On: 12/04/2015 10:00   Assessment & Plan:    Kyle Jensen is a 55 y.o. male Left-sided low back pain with left-sided sciatica - Plan: DG Lumbar Spine Complete, predniSONE (DELTASONE) 20 MG tablet, cyclobenzaprine (FLEXERIL) 5 MG tablet  Decreased patellar reflex - Plan: DG Lumbar Spine Complete, predniSONE (DELTASONE) 20 MG tablet, cyclobenzaprine (FLEXERIL) 5 MG tablet  DDD (degenerative disc disease), lumbar  Suspected degenerative disc disease with sciatica, possible HNP on the left. Decreased patellar reflex on the left. No cauda equina signs or symptoms.  - Prednisone taper, side effects discussed  - Tylenol over-the-counter if needed, and Flexeril up to every 8 hours as needed. Side effects discussed.   -Recheck in 1 week, sooner if worse. Note provided for work through the end of this week.  Meds ordered this encounter  Medications  . predniSONE (DELTASONE) 20 MG tablet    Sig: 3 by mouth for 3 days, then 2 by mouth for 2 days, then 1 by mouth for 2 days, then 1/2 by mouth for 2 days.    Dispense:  16 tablet    Refill:  0  . cyclobenzaprine (FLEXERIL) 5 MG tablet    Sig: 1 pill by mouth up to every 8 hours as needed. Start with one pill by mouth each bedtime as needed due to  sedation    Dispense:  15 tablet    Refill:  0   Patient Instructions   Your symptoms appear to be due to sciatica, and with the decreased reflex at your left knee, we can try prednisone to see if that will help your symptoms. Flexeril or muscle relaxant if needed at night. Do not take anything other than Tylenol over-the-counter while you're taking the prednisone. Follow-up with me within 1 week, sooner if any worsening of your symptoms.  Return to the clinic or go to the nearest emergency room if any of your symptoms worsen or new symptoms occur.  Sciatica Sciatica is pain, weakness, numbness, or tingling along the path of the sciatic nerve. The nerve starts in the lower back and runs down the back of each leg. The nerve controls the muscles in the lower leg and in the back of the knee, while also providing sensation to the back of the thigh, lower leg, and the sole of your foot. Sciatica is a symptom of another medical condition. For instance, nerve damage or certain conditions, such as a herniated disk or bone spur on the spine, pinch or put pressure on the sciatic nerve. This causes the pain, weakness, or other sensations normally associated with sciatica. Generally, sciatica only affects one side of the body. CAUSES   Herniated or slipped disc.  Degenerative disk disease.  A pain disorder involving the narrow muscle in the buttocks (piriformis syndrome).  Pelvic injury or fracture.  Pregnancy.  Tumor (rare). SYMPTOMS  Symptoms can vary from mild to very severe. The symptoms usually travel from the low back to the buttocks and down the back of the leg. Symptoms can include:  Mild tingling or dull aches in the lower back, leg, or hip.  Numbness in the back of the calf or sole of the foot.  Burning sensations in the lower back, leg, or hip.  Sharp pains in the lower back, leg, or hip.  Leg weakness.  Severe back pain inhibiting movement. These symptoms may get worse with  coughing, sneezing, laughing, or prolonged sitting or standing. Also, being overweight may worsen symptoms. DIAGNOSIS  Your caregiver will perform a physical exam to look for common symptoms of sciatica. He or she may ask you to do certain movements or activities that would trigger sciatic nerve pain. Other tests may be performed to find the cause of the sciatica. These may include:  Blood tests.  X-rays.  Imaging tests, such as an MRI or CT scan. TREATMENT  Treatment is directed at the cause of the sciatic pain. Sometimes, treatment is not necessary and the pain and discomfort goes away on its own. If treatment is needed, your caregiver may suggest:  Over-the-counter medicines to relieve pain.  Prescription medicines, such as anti-inflammatory medicine, muscle relaxants, or narcotics.  Applying heat or ice to the painful area.  Steroid injections to lessen pain, irritation, and inflammation around the nerve.  Reducing activity during periods of pain.  Exercising and stretching to strengthen your abdomen and improve flexibility of your spine. Your caregiver may suggest losing weight if the extra weight makes the back pain worse.  Physical therapy.  Surgery to eliminate what is pressing or pinching the nerve, such as a bone spur or part of a herniated disk. HOME CARE INSTRUCTIONS   Only take over-the-counter or prescription medicines for pain or discomfort as directed by your caregiver.  Apply ice to the affected area for 20 minutes, 3-4 times a day for the first 48-72 hours. Then try heat in the same way.  Exercise, stretch, or perform your usual activities if these do not aggravate your pain.  Attend physical therapy sessions as directed by your caregiver.  Keep all follow-up appointments as directed by your caregiver.  Do not wear high heels or shoes that do not provide proper support.  Check your mattress to see if it is too soft. A firm mattress may lessen your pain and  discomfort. SEEK IMMEDIATE MEDICAL CARE IF:   You lose control of your bowel or bladder (incontinence).  You have increasing weakness in the lower back, pelvis,  buttocks, or legs.  You have redness or swelling of your back.  You have a burning sensation when you urinate.  You have pain that gets worse when you lie down or awakens you at night.  Your pain is worse than you have experienced in the past.  Your pain is lasting longer than 4 weeks.  You are suddenly losing weight without reason. MAKE SURE YOU:  Understand these instructions.  Will watch your condition.  Will get help right away if you are not doing well or get worse.   This information is not intended to replace advice given to you by your health care provider. Make sure you discuss any questions you have with your health care provider.   Document Released: 03/17/2001 Document Revised: 12/12/2014 Document Reviewed: 08/02/2011 Elsevier Interactive Patient Education 2016 [employer].     IF you received an x-ray today, you will receive an invoice from [facility]. Please contact [facility] at [phone] with questions or concerns regarding your invoice.   IF you received labwork today, you will receive an invoice from [employer]. Please contact [facility] at [phone] with questions or concerns regarding your invoice.   Our billing staff will not be able to assist you with questions regarding bills from these companies.  You will be contacted with the lab results as soon as they are available. The fastest way to get your results is to activate your My Chart account. Instructions are located on the last page of this paperwork. If you have not heard from [place] regarding the results in 2 weeks, please contact this office.        I personally performed the services described in this documentation, which was scribed in my presence. The recorded information has been  reviewed and considered, and addended by me as needed.   Signed,   Meredith Staggers, MD [facility].  12/04/15 10:20 AM

## 2015-12-04 NOTE — Patient Instructions (Addendum)
Your symptoms appear to be due to sciatica, and with the decreased reflex at your left knee, we can try prednisone to see if that will help your symptoms. Flexeril or muscle relaxant if needed at night. Do not take anything other than Tylenol over-the-counter while you're taking the prednisone. Follow-up with me within 1 week, sooner if any worsening of your symptoms.  Return to the clinic or go to the nearest emergency room if any of your symptoms worsen or new symptoms occur.   Sciatica Sciatica is pain, weakness, numbness, or tingling along the path of the sciatic nerve. The nerve starts in the lower back and runs down the back of each leg. The nerve controls the muscles in the lower leg and in the back of the knee, while also providing sensation to the back of the thigh, lower leg, and the sole of your foot. Sciatica is a symptom of another medical condition. For instance, nerve damage or certain conditions, such as a herniated disk or bone spur on the spine, pinch or put pressure on the sciatic nerve. This causes the pain, weakness, or other sensations normally associated with sciatica. Generally, sciatica only affects one side of the body. CAUSES   Herniated or slipped disc.  Degenerative disk disease.  A pain disorder involving the narrow muscle in the buttocks (piriformis syndrome).  Pelvic injury or fracture.  Pregnancy.  Tumor (rare). SYMPTOMS  Symptoms can vary from mild to very severe. The symptoms usually travel from the low back to the buttocks and down the back of the leg. Symptoms can include:  Mild tingling or dull aches in the lower back, leg, or hip.  Numbness in the back of the calf or sole of the foot.  Burning sensations in the lower back, leg, or hip.  Sharp pains in the lower back, leg, or hip.  Leg weakness.  Severe back pain inhibiting movement. These symptoms may get worse with coughing, sneezing, laughing, or prolonged sitting or standing. Also, being  overweight may worsen symptoms. DIAGNOSIS  Your caregiver will perform a physical exam to look for common symptoms of sciatica. He or she may ask you to do certain movements or activities that would trigger sciatic nerve pain. Other tests may be performed to find the cause of the sciatica. These may include:  Blood tests.  X-rays.  Imaging tests, such as an MRI or CT scan. TREATMENT  Treatment is directed at the cause of the sciatic pain. Sometimes, treatment is not necessary and the pain and discomfort goes away on its own. If treatment is needed, your caregiver may suggest:  Over-the-counter medicines to relieve pain.  Prescription medicines, such as anti-inflammatory medicine, muscle relaxants, or narcotics.  Applying heat or ice to the painful area.  Steroid injections to lessen pain, irritation, and inflammation around the nerve.  Reducing activity during periods of pain.  Exercising and stretching to strengthen your abdomen and improve flexibility of your spine. Your caregiver may suggest losing weight if the extra weight makes the back pain worse.  Physical therapy.  Surgery to eliminate what is pressing or pinching the nerve, such as a bone spur or part of a herniated disk. HOME CARE INSTRUCTIONS   Only take over-the-counter or prescription medicines for pain or discomfort as directed by your caregiver.  Apply ice to the affected area for 20 minutes, 3-4 times a day for the first 48-72 hours. Then try heat in the same way.  Exercise, stretch, or perform your usual activities if  these do not aggravate your pain.  Attend physical therapy sessions as directed by your caregiver.  Keep all follow-up appointments as directed by your caregiver.  Do not wear high heels or shoes that do not provide proper support.  Check your mattress to see if it is too soft. A firm mattress may lessen your pain and discomfort. SEEK IMMEDIATE MEDICAL CARE IF:   You lose control of your  bowel or bladder (incontinence).  You have increasing weakness in the lower back, pelvis, buttocks, or legs.  You have redness or swelling of your back.  You have a burning sensation when you urinate.  You have pain that gets worse when you lie down or awakens you at night.  Your pain is worse than you have experienced in the past.  Your pain is lasting longer than 4 weeks.  You are suddenly losing weight without reason. MAKE SURE YOU:  Understand these instructions.  Will watch your condition.  Will get help right away if you are not doing well or get worse.   This information is not intended to replace advice given to you by your health care provider. Make sure you discuss any questions you have with your health care provider.   Document Released: 03/17/2001 Document Revised: 12/12/2014 Document Reviewed: 08/02/2011 Elsevier Interactive Patient Education 2016 [employer]     IF you received an x-ray today, you will receive an invoice from [facility]. Please contact [facility] at [phone] with questions or concerns regarding your invoice.   IF you received labwork today, you will receive an invoice from [facility]. Please contact [facility] at [phone] with questions or concerns regarding your invoice.   Our billing staff will not be able to assist you with questions regarding bills from these companies.  You will be contacted with the lab results as soon as they are available. The fastest way to get your results is to activate your My Chart account. Instructions are located on the last page of this paperwork. If you have not heard from us regarding the results in 2 weeks, please contact this office.

## 2015-12-05 ENCOUNTER — Telehealth: Payer: Self-pay

## 2015-12-05 NOTE — Telephone Encounter (Signed)
Dr Neva SeatGreene  Patients wife called requesting pain medication for patient.  [phone] Judie Petit(M)

## 2015-12-07 ENCOUNTER — Telehealth: Payer: Self-pay | Admitting: Emergency Medicine

## 2015-12-07 MED ORDER — TRAMADOL HCL 50 MG PO TABS: 50.0000 mg | ORAL_TABLET | Freq: Three times a day (TID) | ORAL | 0 refills | Status: DC | PRN

## 2015-12-07 NOTE — Telephone Encounter (Signed)
Tramadol prescription provided. One every 6 hours as needed. Do not combine this with Tylenol. As symptoms improve, can just use prednisone as prescribed last visit, Flexeril if needed. Be cautious combining the Flexeril and tramadol as both can cause sedation.

## 2015-12-07 NOTE — Telephone Encounter (Signed)
Left message Tramadol rx faxed to pharmacy  Instructions provided as well

## 2015-12-10 ENCOUNTER — Ambulatory Visit (INDEPENDENT_AMBULATORY_CARE_PROVIDER_SITE_OTHER): Payer: 59 | Admitting: Family Medicine

## 2015-12-10 VITALS — BP 128/82 | HR 88 | Temp 98.0°F | Resp 18 | Ht 67.5 in | Wt 189.0 lb

## 2015-12-10 DIAGNOSIS — M5442 Lumbago with sciatica, left side: Secondary | ICD-10-CM

## 2015-12-10 MED ORDER — GABAPENTIN 100 MG PO CAPS: 100.0000 mg | ORAL_CAPSULE | Freq: Three times a day (TID) | ORAL | 3 refills | Status: AC

## 2015-12-10 NOTE — Patient Instructions (Addendum)
Orthopedics will follow-up with you to schedule your appointment.  Take Gabapentin 100 mg up to 3 times daily as needed for sciatica pain.  Continue tramadol 10 mg every 8 hours as needed for pain.    IF you received an x-ray today, you will receive an invoice from [facility]. Please contact [facility] at [phone] with questions or concerns regarding your invoice.   IF you received labwork today, you will receive an invoice from [facility]. Please contact [facility] at [phone] with questions or concerns regarding your invoice.   Our billing staff will not be able to assist you with questions regarding bills from these companies.  You will be contacted with the lab results as soon as they are available. The fastest way to get your results is to activate your My Chart account. Instructions are located on the last page of this paperwork. If you have not heard from us regarding the results in 2 weeks, please contact this office.

## 2015-12-10 NOTE — Progress Notes (Addendum)
Patient ID: Kyle Jensen, male    DOB: April 20, 1960, 55 y.o.   MRN: [id number]  PCP: Willow Ora, MD  Chief Complaint  Patient presents with  . Follow-up    on low back pain     Subjective:   HPI 55 year old male presents for reevaluation of low back pain.  Previously seen and evaluated sided low back pain on August 30 here at [facility] and treated with a muscle relaxer and prednisone. He later called requesting something stronger for pain and a order for Tramadol 50 mg every every 8 hours was prescribed.  He reports out of all medications the tramadol is helping the best with the low back pain. Still experiencing some numbness in his left leg from the knee down to the lower leg.  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  He describes still experiencing the sciatic pain which is shooting down his left leg.  He reports thinking he would feel better by now and is very concerned that his pain is only minimally improved.   . Social History   Social History  . Marital status: Married    Spouse name: N/A  . Number of children: N/A  . Years of education: N/A   Occupational History  . Not on file.   Social History Main Topics  . Smoking status: Current Some Day Smoker  . Smokeless tobacco: Not on file     Comment: on weekends  . Alcohol use Yes     Comment:  occasionally  . Drug use: No  . Sexual activity: Not on file   Other Topics Concern  . Not on file   Social History Narrative  . No narrative on file    . Family History  Problem Relation Age of Onset  . Lung cancer Mother   . Cancer Mother   . Prostate cancer Maternal Grandfather   . Diabetes Maternal Grandfather    Review of Systems    Respiratory: Negative.   Cardiovascular: Negative.   Musculoskeletal: Positive for back pain.       See history of present illness  Neurological:       Shooting pains in left leg. See history of present illness.   Patient Active Problem List   Diagnosis Date Noted  . BACK PAIN 05/15/2010     Prior to Admission medications   Medication Sig Start Date End Date Taking? Authorizing Provider  cyclobenzaprine (FLEXERIL) 5 MG tablet 1 pill by mouth up to every 8 hours as needed. Start with one pill by mouth each bedtime as needed due to sedation 12/04/15  Yes Shade Flood, MD  Multiple Vitamins-Minerals (ONE-A-DAY MENS 50+ ADVANTAGE) TABS Take 1 tablet by mouth daily after breakfast.   Yes Historical Provider, MD  predniSONE (DELTASONE) 20 MG tablet 3 by mouth for 3 days, then 2 by mouth for 2 days, then 1 by mouth for 2 days, then 1/2 by mouth for 2 days. 12/04/15  Yes Shade Flood, MD  traMADol (ULTRAM) 50 MG tablet Take 1 tablet (50 mg total) by mouth every 8 (eight) hours as needed. 12/07/15  Yes Shade Flood, MD     No Known Allergies  Objective:  Physical Exam  Constitutional: He is oriented to person, place, and time. He appears well-developed and well-nourished.  HENT:  Head: Normocephalic and atraumatic.  Right Ear: External ear normal.  Left Ear: External ear normal.  Eyes: Conjunctivae and EOM are normal. Pupils are equal, round, and reactive to light.  Neck: Normal range of motion.  Cardiovascular: Normal rate, regular rhythm, normal heart sounds and intact distal pulses.   Pulmonary/Chest: Effort normal and breath sounds normal.  Musculoskeletal: He exhibits tenderness.  Low back S4-S1 tenderness with flexion and less with extension, right rotation compared to left. Grimaces with movement from a sitting to standing position and from a standing to sitting position.  Neurological: He is alert and oriented to person, place, and time. He has normal strength.   Reflex Scores:      Patellar reflexes are 2+ on the right side and 0 on the left side.      Achilles reflexes are 2+ on the right side and 2+ on the left side. Skin: Skin is warm and dry.  Psychiatric: He has a normal mood and affect. His behavior is normal. Judgment and thought content normal.   Vitals:   12/10/15 1212  BP: 128/82  Pulse: 88  Resp: 18  Temp: 98 F (36.7 C)   Assessment & Plan:  .1. Left-sided low back pain with left-sided sciatica - AMB referral to orthopedics -Start for sciatica pain . gabapentin (NEURONTIN) 100 MG capsule    Sig: Take 1 capsule (100 mg total) by mouth 3 (three) times daily.  -Continue Tramadol 50 mg, every 8 hours as needed for pain. -Return for follow-up as needed.  Ok to return to work on 12/16/15 with improved symptoms.  Godfrey PickKimberly S. Tiburcio PeaHarris, MSN, FNP-C [facility]

## 2015-12-19 ENCOUNTER — Other Ambulatory Visit: Payer: Self-pay | Admitting: Family Medicine

## 2015-12-19 NOTE — Telephone Encounter (Signed)
Kyle Jensen, you last saw pt on 9/5 for f/up and wrote following in OV notes: Continue Tramadol 50 mg, every 8 hours as needed for pain. Your referral to ortho has been sent to [facility] (according to Referral notes) but hasn't been sch yet. Do you want to RF? I left quantity same as orig.

## 2015-12-23 NOTE — Telephone Encounter (Signed)
Called in Rx bc I wasn't sure if it had been faxed over the weekend.

## 2016-01-08 ENCOUNTER — Ambulatory Visit: Payer: 59 | Admitting: Internal Medicine

## 2016-01-09 ENCOUNTER — Encounter: Payer: Self-pay | Admitting: *Deleted

## 2017-03-31 IMAGING — DX DG LUMBAR SPINE COMPLETE 4+V
5 series · 5 of 5 positions shown · non-contrast
Comparison: Lumbar spine series of December 14, 2011

CLINICAL DATA: Low back pain, sciatic symptoms, decreased
left-sided patellar reflex.

EXAM:
LUMBAR SPINE - COMPLETE 4+ VIEW

[l-spine ap]
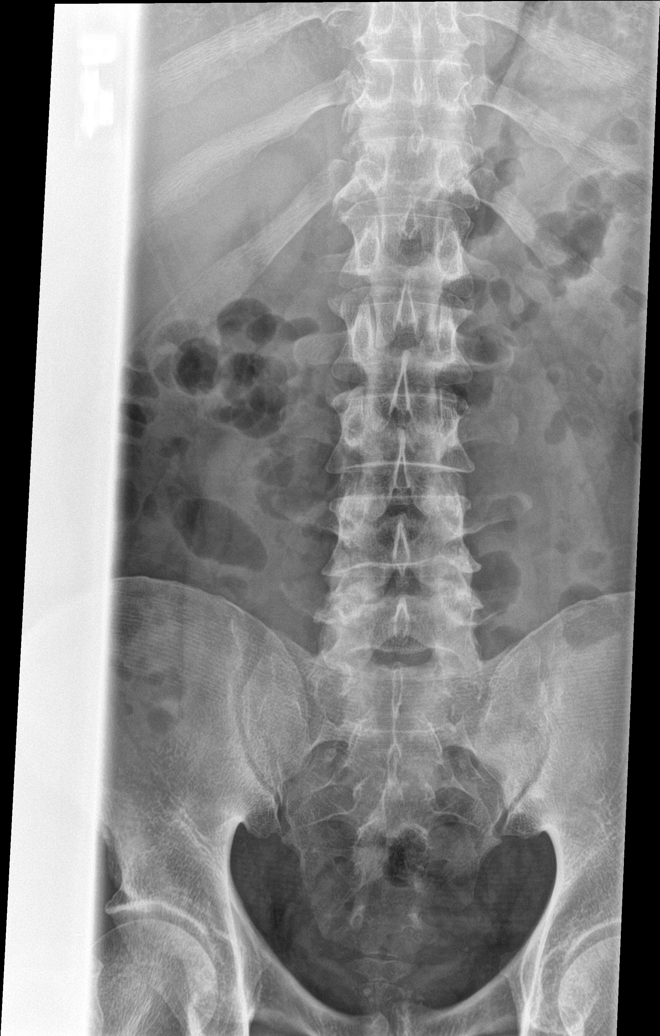

[l-spine obl (1 of 2)]
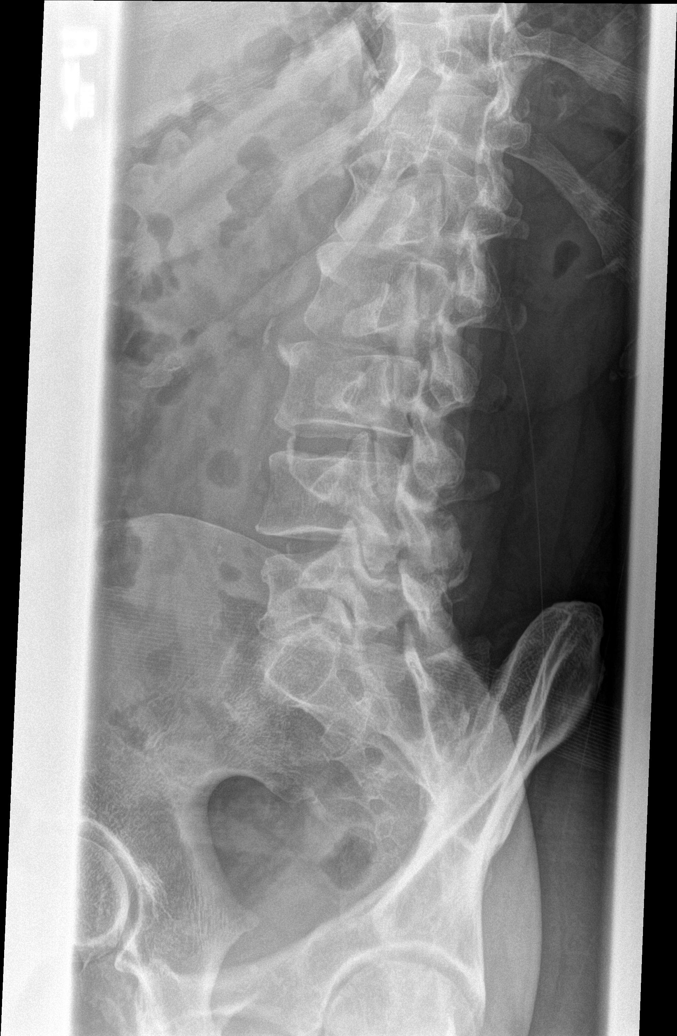

[l-spine obl (2 of 2)]
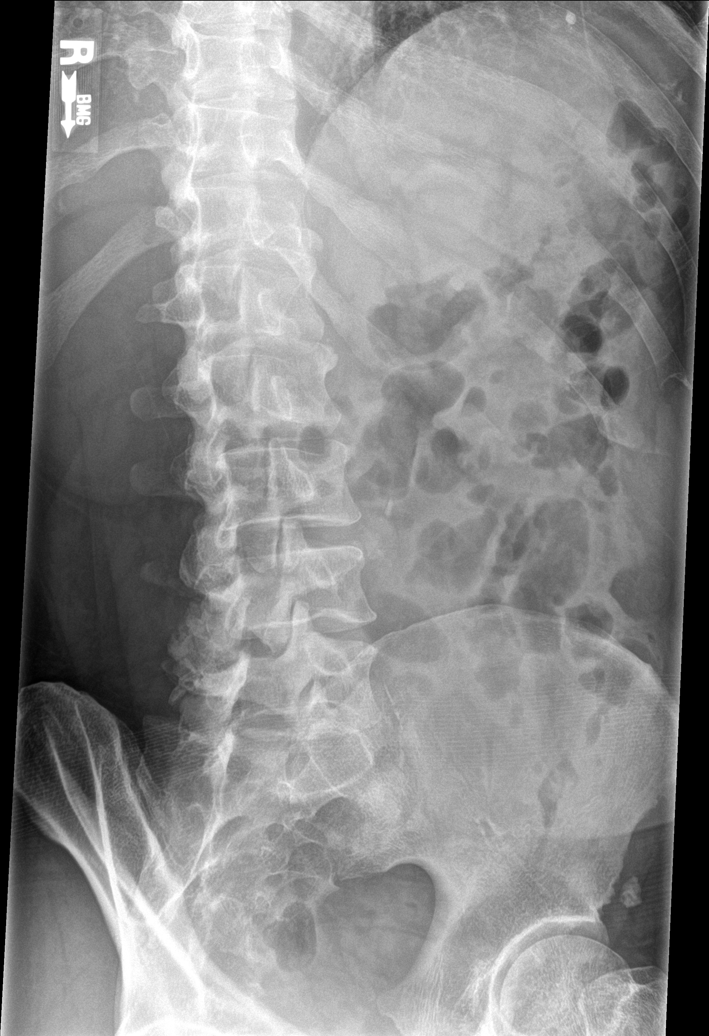

[l-spine lat]
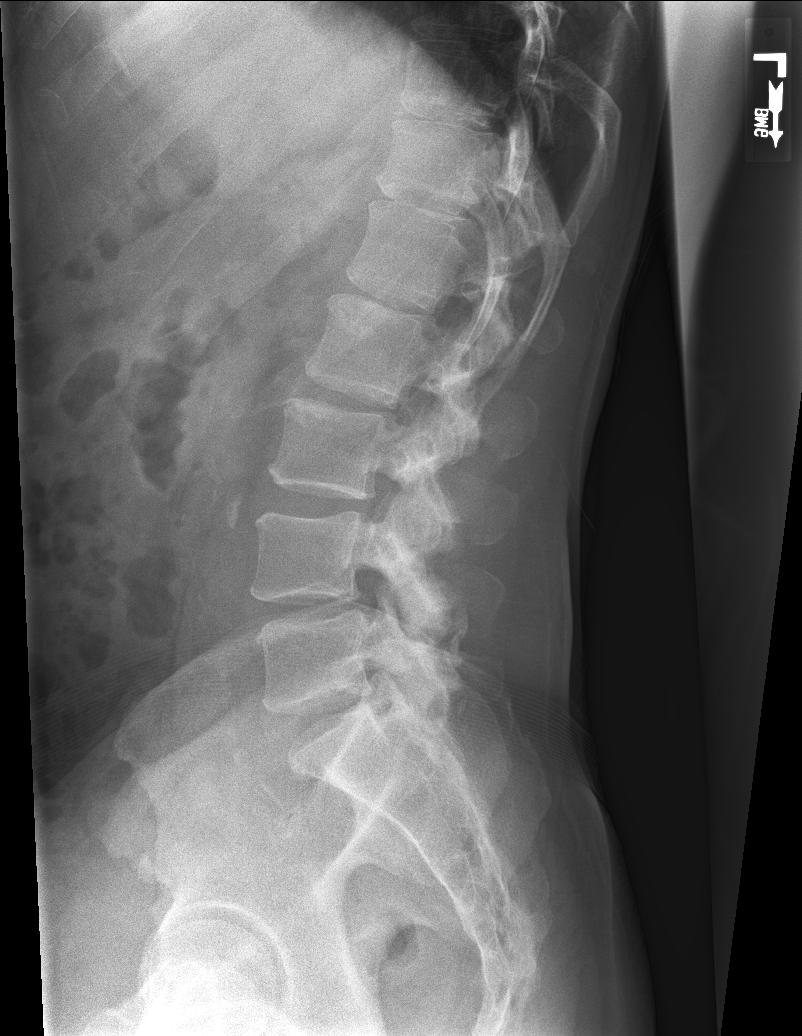

[l-spine l5-s1]
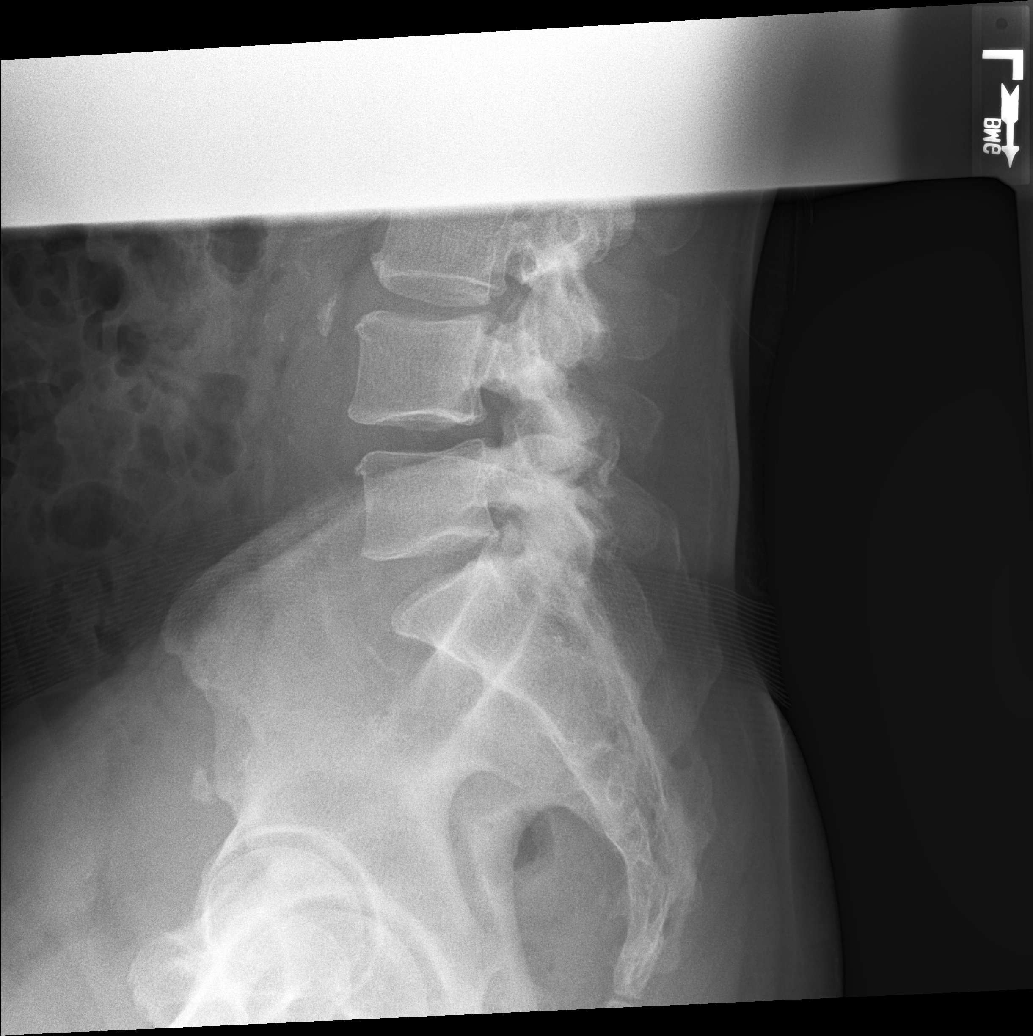

[5 of 5 positions shown; findings below may reference images not displayed]

FINDINGS: The lumbar vertebral bodies are preserved in height. There is
minimal disc space narrowing at L4-5 and at L1-2. This is stable.
There is no spondylolisthesis. There is mild facet joint hypertrophy
at L4-5 and L5-S1. The pedicles and transverse processes are intact.
The observed portions of the sacrum are normal.

There is calcification in the wall of the abdominal aorta and common
iliac vessels.
IMPRESSION: Mild chronic disc space narrowing at L1-2 and at L4-5. Mild facet
joint hypertrophy at L4-5 and L5-S1. No compression fracture.

Aortic atherosclerosis.

## 2023-12-24 ENCOUNTER — Ambulatory Visit: Payer: Self-pay | Admitting: Surgery
